# Patient Record
Sex: Male | Born: 1955 | Race: White | Hispanic: No | Marital: Married | Smoking: Never smoker
Health system: Southern US, Community
[De-identification: ages and names within clinical notes are randomized; demographics above are authoritative.]

## PROBLEM LIST (undated history)

## (undated) DIAGNOSIS — F419 Anxiety disorder, unspecified: Secondary | ICD-10-CM

## (undated) DIAGNOSIS — T7840XA Allergy, unspecified, initial encounter: Secondary | ICD-10-CM

## (undated) HISTORY — PX: ANKLE SURGERY: SHX546

## (undated) HISTORY — PX: EAR CYST EXCISION: SHX22

## (undated) HISTORY — DX: Anxiety disorder, unspecified: F41.9

## (undated) HISTORY — DX: Allergy, unspecified, initial encounter: T78.40XA

## (undated) HISTORY — PX: HERNIA REPAIR: SHX51

---

## 1998-08-26 ENCOUNTER — Encounter (HOSPITAL_BASED_OUTPATIENT_CLINIC_OR_DEPARTMENT_OTHER): Payer: Self-pay | Admitting: General Surgery

## 1998-08-28 ENCOUNTER — Ambulatory Visit (HOSPITAL_COMMUNITY): Admission: RE | Admit: 1998-08-28 | Discharge: 1998-08-28 | Payer: Self-pay | Admitting: General Surgery

## 2000-01-14 ENCOUNTER — Encounter: Admission: RE | Admit: 2000-01-14 | Discharge: 2000-01-14 | Payer: Self-pay | Admitting: *Deleted

## 2000-01-14 ENCOUNTER — Encounter: Payer: Self-pay | Admitting: *Deleted

## 2011-10-02 ENCOUNTER — Emergency Department (HOSPITAL_COMMUNITY): Payer: BC Managed Care – PPO

## 2011-10-02 ENCOUNTER — Other Ambulatory Visit: Payer: Self-pay

## 2011-10-02 ENCOUNTER — Emergency Department (HOSPITAL_COMMUNITY)
Admission: EM | Admit: 2011-10-02 | Discharge: 2011-10-03 | Disposition: A | Discharge status: Home / Self Care | Payer: BC Managed Care – PPO | Attending: Emergency Medicine | Admitting: Emergency Medicine

## 2011-10-02 ENCOUNTER — Encounter (HOSPITAL_COMMUNITY): Payer: Self-pay | Admitting: Emergency Medicine

## 2011-10-02 DIAGNOSIS — K429 Umbilical hernia without obstruction or gangrene: Secondary | ICD-10-CM | POA: Insufficient documentation

## 2011-10-02 DIAGNOSIS — R1013 Epigastric pain: Secondary | ICD-10-CM | POA: Insufficient documentation

## 2011-10-02 DIAGNOSIS — E669 Obesity, unspecified: Secondary | ICD-10-CM | POA: Insufficient documentation

## 2011-10-02 LAB — URINALYSIS, ROUTINE W REFLEX MICROSCOPIC
Bilirubin Urine: NEGATIVE
Glucose, UA: NEGATIVE mg/dL
Hgb urine dipstick: NEGATIVE
Ketones, ur: NEGATIVE mg/dL
Leukocytes, UA: NEGATIVE
Nitrite: NEGATIVE
Protein, ur: NEGATIVE mg/dL
Specific Gravity, Urine: 1.019 (ref 1.005–1.030)
Urobilinogen, UA: 0.2 mg/dL (ref 0.0–1.0)
pH: 8 (ref 5.0–8.0)

## 2011-10-02 LAB — DIFFERENTIAL
Basophils Absolute: 0 10*3/uL (ref 0.0–0.1)
Basophils Relative: 1 % (ref 0–1)
Eosinophils Absolute: 0.2 10*3/uL (ref 0.0–0.7)
Eosinophils Relative: 2 % (ref 0–5)
Lymphocytes Relative: 25 % (ref 12–46)
Lymphs Abs: 2.2 10*3/uL (ref 0.7–4.0)
Monocytes Absolute: 0.7 10*3/uL (ref 0.1–1.0)
Monocytes Relative: 8 % (ref 3–12)
Neutro Abs: 5.7 10*3/uL (ref 1.7–7.7)
Neutrophils Relative %: 65 % (ref 43–77)

## 2011-10-02 LAB — CBC
HCT: 43 % (ref 39.0–52.0)
Hemoglobin: 15 g/dL (ref 13.0–17.0)
MCH: 31.4 pg (ref 26.0–34.0)
MCHC: 34.9 g/dL (ref 30.0–36.0)
MCV: 90 fL (ref 78.0–100.0)
Platelets: 240 10*3/uL (ref 150–400)
RBC: 4.78 MIL/uL (ref 4.22–5.81)
RDW: 13 % (ref 11.5–15.5)
WBC: 8.8 10*3/uL (ref 4.0–10.5)

## 2011-10-02 LAB — LIPASE, BLOOD: Lipase: 31 U/L (ref 11–59)

## 2011-10-02 LAB — BASIC METABOLIC PANEL
BUN: 17 mg/dL (ref 6–23)
CO2: 30 mEq/L (ref 19–32)
Calcium: 9.3 mg/dL (ref 8.4–10.5)
Chloride: 99 mEq/L (ref 96–112)
Creatinine, Ser: 1.41 mg/dL — ABNORMAL HIGH (ref 0.50–1.35)
GFR calc Af Amer: 64 mL/min — ABNORMAL LOW (ref >90)
GFR calc non Af Amer: 55 mL/min — ABNORMAL LOW (ref >90)
Glucose, Bld: 138 mg/dL — ABNORMAL HIGH (ref 70–99)
Potassium: 3.6 mEq/L (ref 3.5–5.1)
Sodium: 138 mEq/L (ref 135–145)

## 2011-10-02 MED ORDER — SODIUM CHLORIDE 0.9 % IV SOLN
Freq: Once | INTRAVENOUS | Status: AC
  Administered 2011-10-02: 21:00:00 via INTRAVENOUS

## 2011-10-02 MED ORDER — GI COCKTAIL ~~LOC~~
30.0000 mL | Freq: Once | ORAL | Status: AC
Start: 2011-10-02 — End: 2011-10-02
  Administered 2011-10-02: 30 mL via ORAL
  Filled 2011-10-02: qty 30

## 2011-10-02 NOTE — ED Notes (Signed)
Pt states that he has been having abdominal pain since this morning. Pain feels like gas moving but pt not sure since pain is sharp. Pt states that the pain was upper GI and he was burping so he tried tums and sode water with no relief from pain but pain is now lower abdomen. Pt states that he has fat poking through his umbilcal area but no surgery needed at this time. Pt states that he has been having regular bowel movements but that they have been harder since the past day. Pt alert and oriented and able to follow commands and move extremities. Pt unable to urinate at this time.

## 2011-10-02 NOTE — Discharge Instructions (Signed)
Return to the emergency department if your pain is getting worse.  Abdominal Pain Abdominal pain can be caused by many things. Your caregiver decides the seriousness of your pain by an examination and possibly blood tests and X-rays. Many cases can be observed and treated at home. Most abdominal pain is not caused by a disease and will probably improve without treatment. However, in many cases, more time must pass before a clear cause of the pain can be found. Before that point, it may not be known if you need more testing, or if hospitalization or surgery is needed. HOME CARE INSTRUCTIONS   Do not take laxatives unless directed by your caregiver.   Take pain medicine only as directed by your caregiver.   Only take over-the-counter or prescription medicines for pain, discomfort, or fever as directed by your caregiver.   Try a clear liquid diet (broth, tea, or water) for as long as directed by your caregiver. Slowly move to a bland diet as tolerated.  SEEK IMMEDIATE MEDICAL CARE IF:   The pain does not go away.   You have a fever.   You keep throwing up (vomiting).   The pain is felt only in portions of the abdomen. Pain in the right side could possibly be appendicitis. In an adult, pain in the left lower portion of the abdomen could be colitis or diverticulitis.   You pass bloody or black tarry stools.  MAKE SURE YOU:   Understand these instructions.   Will watch your condition.   Will get help right away if you are not doing well or get worse.  Document Released: 04/21/2005 Document Revised: 12/06/ 12 Document Reviewed: 02/28/2008 [facility] Patient Information 2012 [place], [place].

## 2011-10-02 NOTE — ED Provider Notes (Signed)
History     CSN: [id number]  Arrival date & time 10/02/11  2004   First MD Initiated Contact with Patient 10/02/11 2048      Chief Complaint  Patient presents with  . Abdominal Pain    (Consider location/radiation/quality/duration/timing/severity/associated sxs/prior treatment) Patient is a 56 y.o. male presenting with abdominal pain. The history is provided by the patient.  Abdominal Pain The primary symptoms of the illness include abdominal pain.  He was eating dinner at 1730 when he started having some crampy upper, pain. Pain would move from location to location. Started and subcostal area and move from side to side, then moved into the midline between the umbilicus and xiphoid, and now has moved back up into the subxiphoid area. Nothing makes the pain better, nothing makes it worse. There is no associated nausea, vomiting, diarrhea. He denies any chest pain, dyspnea, diaphoresis. He feels bubbles going through like it's gas. He also has an umbilical hernia which has not changed. Pain was rated at 5/10 at its worse, is 0/10 currently.  History reviewed. No pertinent past medical history.  History reviewed. No pertinent past surgical history.  No family history on file.  History  Substance Use Topics  . Smoking status: Never Smoker   . Smokeless tobacco: Not on file  . Alcohol Use: Yes      Review of Systems  Gastrointestinal: Positive for abdominal pain.  All other systems reviewed and are negative.    Allergies  Review of patient's allergies indicates no known allergies.  Home Medications   Current Outpatient Rx  Name Route Sig Dispense Refill  . CALCIUM CARBONATE ANTACID 500 MG PO CHEW Oral Chew 3 tablets by mouth daily as needed. For heartburn      BP 156/90  Pulse 60  Temp(Src) 98.2 F (36.8 C) (Oral)  Resp 20  SpO2 96%  Physical Exam  Nursing note and vitals reviewed.  56 year old male who is obese. He is resting comfortably and in no acute  distress. Vital signs are significant for mild hypertension with blood pressure 156/90. Oxygen saturation is 96% which is normal. Head is normocephalic and atraumatic. PERRLA, EOMI. There is no scleral icterus. Oropharynx is clear. Neck is nontender and supple without adenopathy. Lungs are clear without rales, wheezes, rhonchi. Back is nontender there's no CVA tenderness. Heart has regular rate and rhythm without murmur. Abdomen is soft and flat. Moderate size umbilical hernia is present which is easily reducible, but is tender when reduced. There is also mild to moderate epigastric tenderness. There is no hepatosplenomegaly. Peristalsis is normal active. Extremities have no cyanosis or edema, full range of motion is present. Skin is warm and dry without rash. Neurologic: Mental status is normal, cranial nerves are intact, there no focal motor or sensory deficits.  ED Course  Procedures (including critical care time)  Results for orders placed during the hospital encounter of 10/02/11  CBC      Component Value Range   WBC 8.8  4.0 - 10.5 (K/uL)   RBC 4.78  4.22 - 5.81 (MIL/uL)   Hemoglobin 15.0  13.0 - 17.0 (g/dL)   HCT 40.9  81.1 - 91.4 (%)   MCV 90.0  78.0 - 100.0 (fL)   MCH 31.4  26.0 - 34.0 (pg)   MCHC 34.9  30.0 - 36.0 (g/dL)   RDW 78.2  95.6 - 21.3 (%)   Platelets 240  150 - 400 (K/uL)  DIFFERENTIAL      Component Value Range  Neutrophils Relative 65  43 - 77 (%)   Neutro Abs 5.7  1.7 - 7.7 (K/uL)   Lymphocytes Relative 25  12 - 46 (%)   Lymphs Abs 2.2  0.7 - 4.0 (K/uL)   Monocytes Relative 8  3 - 12 (%)   Monocytes Absolute 0.7  0.1 - 1.0 (K/uL)   Eosinophils Relative 2  0 - 5 (%)   Eosinophils Absolute 0.2  0.0 - 0.7 (K/uL)   Basophils Relative 1  0 - 1 (%)   Basophils Absolute 0.0  0.0 - 0.1 (K/uL)  BASIC METABOLIC PANEL      Component Value Range   Sodium 138  135 - 145 (mEq/L)   Potassium 3.6  3.5 - 5.1 (mEq/L)   Chloride 99  96 - 112 (mEq/L)   CO2 30  19 - 32 (mEq/L)    Glucose, Bld 138 (*) 70 - 99 (mg/dL)   BUN 17  6 - 23 (mg/dL)   Creatinine, Ser 9.14 (*) 0.50 - 1.35 (mg/dL)   Calcium 9.3  8.4 - 78.2 (mg/dL)   GFR calc non Af Amer 55 (*) >90 (mL/min)   GFR calc Af Amer 64 (*) >90 (mL/min)  URINALYSIS, ROUTINE W REFLEX MICROSCOPIC      Component Value Range   Color, Urine YELLOW  YELLOW    APPearance TURBID (*) CLEAR    Specific Gravity, Urine 1.019  1.005 - 1.030    pH 8.0  5.0 - 8.0    Glucose, UA NEGATIVE  NEGATIVE (mg/dL)   Hgb urine dipstick NEGATIVE  NEGATIVE    Bilirubin Urine NEGATIVE  NEGATIVE    Ketones, ur NEGATIVE  NEGATIVE (mg/dL)   Protein, ur NEGATIVE  NEGATIVE (mg/dL)   Urobilinogen, UA 0.2  0.0 - 1.0 (mg/dL)   Nitrite NEGATIVE  NEGATIVE    Leukocytes, UA NEGATIVE  NEGATIVE   LIPASE, BLOOD      Component Value Range   Lipase 31  11 - 59 (U/L)  URINE MICROSCOPIC-ADD ON      Component Value Range   Squamous Epithelial / LPF RARE  RARE    WBC, UA 0-2  <3 (WBC/hpf)   RBC / HPF 0-2  <3 (RBC/hpf)   Urine-Other AMORPHOUS URATES/PHOSPHATES     Dg Abd Acute W/chest  10/02/2011  *RADIOLOGY REPORT*  Clinical Data: Abdominal pain.  ACUTE ABDOMEN SERIES (ABDOMEN 2 VIEW & CHEST 1 VIEW)  Comparison: None.  Findings: Normal sized heart.  Small amount of linear density at the left lung base.  Otherwise, clear lungs.  Paucity of intestinal gas with no dilated bowel loops or free peritoneal air seen.  Mild scoliosis.  IMPRESSION:  1.  Minimal left basilar atelectasis or scarring. 2.  Paucity of intestinal gas with no acute abdominal abnormality seen.  Original Report Authenticated By: Darrol Angel, M.D.      Date: 10/02/2011  Rate: 53  Rhythm: sinus bradycardia  QRS Axis: normal  Intervals: normal  ST/T Wave abnormalities: normal  Conduction Disutrbances:none  Narrative Interpretation: Sinus bradycardia, otherwise normal ECG. When compared with ECG of 08/26/1998, no significant changes are seen.   Old EKG Reviewed: none available  He was  given a GI cocktail with significant but not complete relief of his pain. He continues to have a sense of bubbles moving around his upper abdomen. Reexam continues to show benign abdomen. He is sent home with instructions to followup with his family doctor and return to the emergency department if symptoms worsen. He  is given a referral to [facility] surgery for evaluation of his umbilical hernia.  1. Abdominal pain   2. Umbilical hernia       MDM  Abdominal pain which is probably benign dyspepsia. Workup has been ordered and will be given a therapeutic trial of a GI cocktail.        Dione Booze, MD 10/02/11 2340

## 2011-10-02 NOTE — ED Notes (Addendum)
Requested pt attempt to provide urine sample. Pt stated he was unable and would provide one as soon as possible.

## 2011-10-02 NOTE — ED Notes (Signed)
PT. REPORTS GENERALIZED ABDOMINAL PAIN ONSET THIS AFTERNOON WHILE EATING , " FEELS LIKE GAS" , DENIES NAUSEA OR VOMITTING .

## 2012-06-09 ENCOUNTER — Ambulatory Visit (INDEPENDENT_AMBULATORY_CARE_PROVIDER_SITE_OTHER): Payer: Self-pay | Admitting: General Surgery

## 2012-06-13 ENCOUNTER — Other Ambulatory Visit (INDEPENDENT_AMBULATORY_CARE_PROVIDER_SITE_OTHER): Payer: Self-pay | Admitting: General Surgery

## 2012-06-13 ENCOUNTER — Encounter (INDEPENDENT_AMBULATORY_CARE_PROVIDER_SITE_OTHER): Payer: Self-pay | Admitting: General Surgery

## 2012-06-13 ENCOUNTER — Ambulatory Visit (INDEPENDENT_AMBULATORY_CARE_PROVIDER_SITE_OTHER): Payer: PRIVATE HEALTH INSURANCE | Admitting: General Surgery

## 2012-06-13 VITALS — BP 119/77 | HR 82 | Temp 98.0°F | Resp 16 | Ht 73.5 in | Wt 216.8 lb

## 2012-06-13 DIAGNOSIS — K429 Umbilical hernia without obstruction or gangrene: Secondary | ICD-10-CM

## 2012-06-13 NOTE — Progress Notes (Signed)
Patient ID: Cameron Grant, male   DOB: 1956-03-12, 56 y.o.   MRN: [id number]  Chief Complaint  Patient presents with  . Hernia    HPI Cameron Grant is a 56 y.o. male.  Who is seen for several year history of a umbilical hernia.the patient was recently seen in the ER her abdominal and epigastric pain. Patient was ruled out for cardiac event. Patient's had no incidence of strangulation or obstructive symptoms. HPI  History reviewed. No pertinent past medical history.  Past Surgical History  Procedure Date  . Ankle surgery   . Ear cyst excision     History reviewed. No pertinent family history.  Social History History  Substance Use Topics  . Smoking status: Never Smoker   . Smokeless tobacco: Not on file  . Alcohol Use: 1.2 oz/week    2 Cans of beer per week    No Known Allergies  No current outpatient prescriptions on file.    Review of Systems Review of Systems  All other systems reviewed and are negative.    Blood pressure 119/77, pulse 82, temperature 98 F (36.7 C), temperature source Temporal, resp. rate 16, height 6' 1.5" (1.867 m), weight 216 lb 12.8 oz (98.34 kg).  Physical Exam Physical Exam  Constitutional: He is oriented to person, place, and time. He appears well-developed and well-nourished.  HENT:  Head: Normocephalic and atraumatic.  Eyes: Conjunctivae normal and EOM are normal. Pupils are equal, round, and reactive to light.  Neck: Normal range of motion.  Cardiovascular: Normal rate, regular rhythm and normal heart sounds.   Pulmonary/Chest: Effort normal and breath sounds normal.  Abdominal: Soft. Bowel sounds are normal.  Musculoskeletal: Normal range of motion.  Neurological: He is alert and oriented to person, place, and time.    Data Reviewed none  Assessment    56 year old male with an umbilical hernia.    Plan    1. We will proceed to the operating room for laparoscopic repair with mesh.  2.All risks and benefits were  discussed with the patient, to generally include infection, bleeding, damage to surrounding structures, and recurrence. Alternatives were offered and described.  All questions were answered and the patient voiced understanding of the procedure and wishes to proceed at this point.        Marigene Ehlers., Muhammad Vacca 06/13/2012, 3:17 PM

## 2012-07-17 DIAGNOSIS — K429 Umbilical hernia without obstruction or gangrene: Secondary | ICD-10-CM

## 2012-08-04 ENCOUNTER — Encounter (INDEPENDENT_AMBULATORY_CARE_PROVIDER_SITE_OTHER): Payer: Self-pay | Admitting: General Surgery

## 2012-08-04 ENCOUNTER — Ambulatory Visit (INDEPENDENT_AMBULATORY_CARE_PROVIDER_SITE_OTHER): Payer: PRIVATE HEALTH INSURANCE | Admitting: General Surgery

## 2012-08-04 VITALS — BP 118/82 | HR 62 | Temp 97.0°F | Resp 18 | Ht 73.0 in | Wt 214.2 lb

## 2012-08-04 DIAGNOSIS — Z09 Encounter for follow-up examination after completed treatment for conditions other than malignant neoplasm: Secondary | ICD-10-CM

## 2012-08-04 NOTE — Progress Notes (Signed)
Patient ID: Cameron Grant, male   DOB: 06-18-1956, 57 y.o.   MRN: [id number] The patient is a 57 year old status post laparoscopic umbilical hernia repair with mesh. Patient is to do well postoperatively and is not any pain. He does feel some stretching of his abdominal wall at times But this is improving.  On exam: Wound clean dry and intact  No palpable hernia on palpation  Assessment and plan: Patient follow up when necessary We discussed weight restrictions for another 2-3 weeks

## 2014-04-20 ENCOUNTER — Ambulatory Visit (INDEPENDENT_AMBULATORY_CARE_PROVIDER_SITE_OTHER): Payer: No Typology Code available for payment source | Admitting: Family Medicine

## 2014-04-20 VITALS — BP 124/80 | HR 65 | Temp 98.0°F | Resp 16 | Ht 72.0 in | Wt 228.2 lb

## 2014-04-20 DIAGNOSIS — Z1383 Encounter for screening for respiratory disorder NEC: Secondary | ICD-10-CM

## 2014-04-20 DIAGNOSIS — Z1211 Encounter for screening for malignant neoplasm of colon: Secondary | ICD-10-CM

## 2014-04-20 DIAGNOSIS — Z1389 Encounter for screening for other disorder: Secondary | ICD-10-CM

## 2014-04-20 DIAGNOSIS — Z0000 Encounter for general adult medical examination without abnormal findings: Secondary | ICD-10-CM

## 2014-04-20 DIAGNOSIS — Z136 Encounter for screening for cardiovascular disorders: Secondary | ICD-10-CM

## 2014-04-20 DIAGNOSIS — Z23 Encounter for immunization: Secondary | ICD-10-CM

## 2014-04-20 DIAGNOSIS — Z125 Encounter for screening for malignant neoplasm of prostate: Secondary | ICD-10-CM

## 2014-04-20 LAB — COMPREHENSIVE METABOLIC PANEL
ALBUMIN: 4.3 g/dL (ref 3.5–5.2)
ALT: 19 U/L (ref 0–53)
AST: 17 U/L (ref 0–37)
Alkaline Phosphatase: 59 U/L (ref 39–117)
BUN: 14 mg/dL (ref 6–23)
CALCIUM: 10 mg/dL (ref 8.4–10.5)
CHLORIDE: 102 meq/L (ref 96–112)
CO2: 27 meq/L (ref 19–32)
Creat: 1.11 mg/dL (ref 0.50–1.35)
GLUCOSE: 102 mg/dL — AB (ref 70–99)
POTASSIUM: 4.7 meq/L (ref 3.5–5.3)
Sodium: 136 mEq/L (ref 135–145)
Total Bilirubin: 0.7 mg/dL (ref 0.2–1.2)
Total Protein: 7.6 g/dL (ref 6.0–8.3)

## 2014-04-20 LAB — LIPID PANEL
CHOLESTEROL: 163 mg/dL (ref 0–200)
HDL: 42 mg/dL (ref >39)
LDL Cholesterol: 101 mg/dL — ABNORMAL HIGH (ref 0–99)
Total CHOL/HDL Ratio: 3.9 Ratio
Triglycerides: 102 mg/dL (ref <150)
VLDL: 20 mg/dL (ref 0–40)

## 2014-04-20 LAB — CBC
HCT: 42.3 % (ref 39.0–52.0)
Hemoglobin: 14.7 g/dL (ref 13.0–17.0)
MCH: 30.9 pg (ref 26.0–34.0)
MCHC: 34.8 g/dL (ref 30.0–36.0)
MCV: 88.9 fL (ref 78.0–100.0)
PLATELETS: 253 10*3/uL (ref 150–400)
RBC: 4.76 MIL/uL (ref 4.22–5.81)
RDW: 13.7 % (ref 11.5–15.5)
WBC: 5.4 10*3/uL (ref 4.0–10.5)

## 2014-04-20 LAB — TSH: TSH: 1.417 u[IU]/mL (ref 0.350–4.500)

## 2014-04-20 NOTE — Progress Notes (Signed)
Subjective:  This chart was scribed for Norberto Sorenson, MD by Andrew Au, ED Scribe. This patient was seen in room 3 and the patient's care was started at 9:51 AM.  Patient ID: Cameron Grant, male    DOB: 09-26-1955, 58 y.o.   MRN: [id number]  HPI Chief Complaint  Patient presents with  . cancer screening    for insurance    HPI Comments: Cameron Grant is a 58 y.o. male who presents to the [facility] for a cancer screening for insurance.   TDAP- Pt is unsure of last TDAP. Pt denies Flu vaccine.  Pt reports family hx of breast cancer. He states his mother had breast cancer 3 times.  Pt denies hx of colonoscopy. Pt wants a colonoscopy in the future. Pt is requesting PSA testing.    Pt is a fomer smoker who quit in 1992. He reports he used to smoke a pack a day. Pt denies alcohol. Pt takes multi vitamins.    Pt has not eaten today.  Pt denies PCP.  Past Medical History  Diagnosis Date  . Allergy   . Anxiety    Past Surgical History  Procedure Laterality Date  . Ankle surgery    . Ear cyst excision     Prior to Admission medications   Not on File   No Known Allergies Family History  Problem Relation Age of Onset  . Cancer Mother   . Heart disease Father   . Heart disease Maternal Grandmother   . Stroke Maternal Grandmother   . Stroke Paternal Grandfather   . Heart disease Paternal Grandfather    History   Social History  . Marital Status: Married    Spouse Name: N/A    Number of Children: N/A  . Years of Education: N/A   Social History Main Topics  . Smoking status: Never Smoker   . Smokeless tobacco: None  . Alcohol Use: 1.2 oz/week    2 Cans of beer per week  . Drug Use: No  . Sexual Activity: None   Other Topics Concern  . None   Social History Narrative  . None    Review of Systems  Constitutional: Negative for fever and chills.  Gastrointestinal: Negative for nausea, vomiting and diarrhea.  Musculoskeletal: Negative for  myalgias.   Objective:   Physical Exam  Nursing note and vitals reviewed. Constitutional: He is oriented to person, place, and time. He appears well-developed and well-nourished. No distress.  HENT:  Head: Normocephalic and atraumatic.  Right Ear: Tympanic membrane is retracted.  Left Ear: Tympanic membrane is retracted.  Mouth/Throat: Uvula is midline, oropharynx is clear and moist and mucous membranes are normal.  Eyes: Conjunctivae and EOM are normal.  Neck: Neck supple. No mass and no thyromegaly present.  Cardiovascular: Normal rate, regular rhythm, S1 normal, S2 normal, normal heart sounds and intact distal pulses.  Exam reveals no gallop and no friction rub.   No murmur heard. Pulses:      Dorsalis pedis pulses are 2+ on the right side, and 2+ on the left side.  Pulmonary/Chest: Effort normal and breath sounds normal. No respiratory distress. He has no wheezes. He has no rales. He exhibits no tenderness.  Abdominal: Soft. Bowel sounds are normal. There is no hepatosplenomegaly, splenomegaly or hepatomegaly. There is no tenderness. Hernia confirmed negative in the right inguinal area and confirmed negative in the left inguinal area.  Musculoskeletal: Normal range of motion.  Negative straight leg.  No LE edema. Normal cervical ROM. Normal shoulder ROM. No point tenderness. No spinous tenderness C, T and L spine. Normal thoracic and lumbar  Lymphadenopathy:    He has no cervical adenopathy.  Neurological: He is alert and oriented to person, place, and time.  Reflex Scores:      Patellar reflexes are 2+ on the right side and 2+ on the left side. Skin: Skin is warm and dry.  Psychiatric: He has a normal mood and affect. His behavior is normal.   Filed Vitals:   04/20/14 0913  BP: 124/80  Pulse: 65  Temp: 98 F (36.7 C)  TempSrc: Oral  Resp: 16  Height: 6' (1.829 m)  Weight: 228 lb 3.2 oz (103.511 kg)  SpO2: 98%     Assessment & Plan:    Pt had an EKG march 2013.  Recommended digital rectal exam at CPE next yr. Routine general medical examination at a health care facility - Plan: Lipid panel, Comprehensive metabolic panel, PSA, TSH, CBC, CANCELED: CBC  Special screening for malignant neoplasms, colon - Plan: Lipid panel, Comprehensive metabolic panel, PSA, TSH, Ambulatory referral to Gastroenterology, CBC, CANCELED: CBC  Screening for prostate cancer - Plan: Lipid panel, Comprehensive metabolic panel, PSA, TSH, CBC, CANCELED: CBC  Screening for cardiovascular, respiratory, and genitourinary diseases - Plan: Lipid panel, Comprehensive metabolic panel, PSA, TSH, CBC, CANCELED: CBC   I personally performed the services described in this documentation, which was scribed in my presence. The recorded information has been reviewed and considered, and addended by me as needed.  Norberto Sorenson, MD MPH   Results for orders placed in visit on 04/20/14  LIPID PANEL      Result Value Ref Range   Cholesterol 163  0 - 200 mg/dL   Triglycerides 161  <096 mg/dL   HDL 42  >04 mg/dL   Total CHOL/HDL Ratio 3.9     VLDL 20  0 - 40 mg/dL   LDL Cholesterol 540 (*) 0 - 99 mg/dL  COMPREHENSIVE METABOLIC PANEL      Result Value Ref Range   Sodium 136  135 - 145 mEq/L   Potassium 4.7  3.5 - 5.3 mEq/L   Chloride 102  96 - 112 mEq/L   CO2 27  19 - 32 mEq/L   Glucose, Bld 102 (*) 70 - 99 mg/dL   BUN 14  6 - 23 mg/dL   Creat 9.81  1.91 - 4.78 mg/dL   Total Bilirubin 0.7  0.2 - 1.2 mg/dL   Alkaline Phosphatase 59  39 - 117 U/L   AST 17  0 - 37 U/L   ALT 19  0 - 53 U/L   Total Protein 7.6  6.0 - 8.3 g/dL   Albumin 4.3  3.5 - 5.2 g/dL   Calcium 29.5  8.4 - 62.1 mg/dL  PSA      Result Value Ref Range   PSA 2.56  <=4.00 ng/mL  TSH      Result Value Ref Range   TSH 1.417  0.350 - 4.500 uIU/mL  CBC      Result Value Ref Range   WBC 5.4  4.0 - 10.5 K/uL   RBC 4.76  4.22 - 5.81 MIL/uL   Hemoglobin 14.7  13.0 - 17.0 g/dL   HCT 30.8  65.7 - 84.6 %   MCV 88.9  78.0 - 100.0  fL   MCH 30.9  26.0 - 34.0 pg   MCHC 34.8  30.0 - 36.0 g/dL  RDW 13.7  11.5 - 15.5 %   Platelets 253  150 - 400 K/uL

## 2014-04-20 NOTE — Patient Instructions (Signed)

## 2014-04-22 LAB — PSA: PSA: 2.56 ng/mL (ref <=4.00)

## 2014-04-23 ENCOUNTER — Encounter: Payer: Self-pay | Admitting: Family Medicine

## 2018-02-09 ENCOUNTER — Other Ambulatory Visit (HOSPITAL_COMMUNITY): Payer: 59

## 2018-02-09 ENCOUNTER — Ambulatory Visit (HOSPITAL_COMMUNITY)
Admission: RE | Admit: 2018-02-09 | Discharge: 2018-02-09 | Disposition: A | Discharge status: Home / Self Care | Payer: 59 | Source: Ambulatory Visit | Attending: Cardiology | Admitting: Cardiology

## 2018-02-09 ENCOUNTER — Encounter (HOSPITAL_COMMUNITY): Payer: Self-pay

## 2018-02-09 ENCOUNTER — Encounter (HOSPITAL_COMMUNITY)
Admission: RE | Disposition: A | Discharge status: Home / Self Care | Payer: Self-pay | Source: Ambulatory Visit | Attending: Cardiology

## 2018-02-09 ENCOUNTER — Other Ambulatory Visit: Payer: Self-pay

## 2018-02-09 DIAGNOSIS — G459 Transient cerebral ischemic attack, unspecified: Secondary | ICD-10-CM

## 2018-02-09 DIAGNOSIS — Z5309 Procedure and treatment not carried out because of other contraindication: Secondary | ICD-10-CM | POA: Diagnosis not present

## 2018-02-09 DIAGNOSIS — H3320 Serous retinal detachment, unspecified eye: Secondary | ICD-10-CM

## 2018-02-09 SURGERY — CANCELLED PROCEDURE

## 2018-02-09 NOTE — Progress Notes (Signed)
Patient came in today for a TEE and ate a poptart at 515 am. He also arrived with no one to drive him home. Patient reports that he was not given any instruction of what to do for this test today. Dr. Rosemary HolmsPatwardhan was notified and he instructed to reschedule for tomorrow at 3 pm.

## 2018-02-09 NOTE — H&P (Signed)
Cameron Grant is an 62 y.o. male.   Chief Complaint: Monoocular vision loss HPI:   62 y/o Caucasian male witj no significant past medical history. He was seen by [facility], Dr. Georges Mousehristopher Shah and found to have posterior vitreous detachment, as well as monoocular vision loss, concerning for TIA like symptoms. [facility]. He was started on ASA 325 mg.  Past Medical History:  Diagnosis Date  . Allergy   . Anxiety     Past Surgical History:  Procedure Laterality Date  . ANKLE SURGERY    . EAR CYST EXCISION      Family History  Problem Relation Age of Onset  . Cancer Mother   . Heart disease Father   . Heart disease Maternal Grandmother   . Stroke Maternal Grandmother   . Stroke Paternal Grandfather   . Heart disease Paternal Grandfather    Social History:  reports that he has never smoked. He has never used smokeless tobacco. He reports that he drinks about 1.2 oz of alcohol per week. He reports that he does not use drugs.  Allergies: No Known Allergies  ASA 325 mg   Review of Systems  Constitutional: Negative.   HENT: Negative.   Eyes:       Transient vision loss  Respiratory: Negative.   Cardiovascular: Negative for chest pain and palpitations.  Gastrointestinal: Negative.   Genitourinary: Negative.   Skin: Negative.   Neurological: Negative for loss of consciousness and weakness.  Endo/Heme/Allergies: Negative.   Psychiatric/Behavioral: Negative.   All other systems reviewed and are negative.   There were no vitals taken for this visit. Physical Exam  Nursing note and vitals reviewed. Constitutional: He is oriented to person, place, and time. He appears well-developed and well-nourished.  HENT:  Head: Normocephalic and atraumatic.  Eyes: Pupils are equal, round, and reactive to light. Conjunctivae are normal.  Neck: Normal range of motion. No JVD present.  Cardiovascular: Normal rate, regular rhythm  and normal heart sounds.  No murmur heard. Respiratory: Effort normal and breath sounds normal.  GI: Soft. Bowel sounds are normal.  Musculoskeletal: He exhibits no edema.  Lymphadenopathy:    He has no cervical adenopathy.  Neurological: He is alert and oriented to person, place, and time. No cranial nerve deficit.  Skin: Skin is warm and dry.  Psychiatric: He has a normal mood and affect.     Assessment/Plan  62 y/o Caucasian male with transient mono ocular vision loss similar to TIA  TEE Carotid duplex (Will be performed outpatient in the office)  Patient presented on 07/18 morning for TEE but was not NPO. TEE was thus rescheduled to 7/19 afternoon.  Elder NegusManish J Dajae Kizer, MD 02/09/2018, 8:38 AM  Elder NegusManish J Saray Capasso, MD [facility] Pager: [phone] Office: [phone] If no answer Cell [phone]

## 2018-02-10 ENCOUNTER — Ambulatory Visit (HOSPITAL_COMMUNITY)
Admission: RE | Admit: 2018-02-10 | Discharge: 2018-02-10 | Disposition: A | Discharge status: Home / Self Care | Payer: 59 | Source: Ambulatory Visit | Attending: Cardiology | Admitting: Cardiology

## 2018-02-10 ENCOUNTER — Encounter (HOSPITAL_COMMUNITY): Payer: Self-pay | Admitting: *Deleted

## 2018-02-10 ENCOUNTER — Encounter (HOSPITAL_COMMUNITY)
Admission: RE | Disposition: A | Discharge status: Home / Self Care | Payer: Self-pay | Source: Ambulatory Visit | Attending: Cardiology

## 2018-02-10 ENCOUNTER — Ambulatory Visit (HOSPITAL_COMMUNITY): Payer: 59

## 2018-02-10 DIAGNOSIS — F419 Anxiety disorder, unspecified: Secondary | ICD-10-CM | POA: Diagnosis not present

## 2018-02-10 DIAGNOSIS — Q211 Atrial septal defect: Secondary | ICD-10-CM | POA: Insufficient documentation

## 2018-02-10 DIAGNOSIS — Z8249 Family history of ischemic heart disease and other diseases of the circulatory system: Secondary | ICD-10-CM | POA: Insufficient documentation

## 2018-02-10 DIAGNOSIS — G459 Transient cerebral ischemic attack, unspecified: Secondary | ICD-10-CM

## 2018-02-10 DIAGNOSIS — H3320 Serous retinal detachment, unspecified eye: Secondary | ICD-10-CM

## 2018-02-10 HISTORY — PX: TEE WITHOUT CARDIOVERSION: SHX5443

## 2018-02-10 SURGERY — ECHOCARDIOGRAM, TRANSESOPHAGEAL
Anesthesia: Moderate Sedation

## 2018-02-10 MED ORDER — BUTAMBEN-TETRACAINE-BENZOCAINE 2-2-14 % EX AERO
INHALATION_SPRAY | CUTANEOUS | Status: DC | PRN
  Administered 2018-02-10: 2 via TOPICAL

## 2018-02-10 MED ORDER — DIPHENHYDRAMINE HCL 50 MG/ML IJ SOLN
INTRAMUSCULAR | Status: AC
  Filled 2018-02-10: qty 1

## 2018-02-10 MED ORDER — SODIUM CHLORIDE 0.9 % IV SOLN: INTRAVENOUS | Status: DC

## 2018-02-10 MED ORDER — FENTANYL CITRATE (PF) 100 MCG/2ML IJ SOLN
INTRAMUSCULAR | Status: DC | PRN
  Administered 2018-02-10: 50 ug via INTRAVENOUS

## 2018-02-10 MED ORDER — MIDAZOLAM HCL 5 MG/ML IJ SOLN
INTRAMUSCULAR | Status: DC | PRN
  Administered 2018-02-10 (×3): 1 mg via INTRAVENOUS

## 2018-02-10 MED ORDER — FENTANYL CITRATE (PF) 100 MCG/2ML IJ SOLN
INTRAMUSCULAR | Status: AC
  Filled 2018-02-10: qty 2

## 2018-02-10 MED ORDER — SODIUM CHLORIDE 0.9 % IV SOLN
INTRAVENOUS | Status: DC
  Administered 2018-02-10: 500 mL via INTRAVENOUS

## 2018-02-10 MED ORDER — MIDAZOLAM HCL 5 MG/ML IJ SOLN
INTRAMUSCULAR | Status: AC
  Filled 2018-02-10: qty 2

## 2018-02-10 NOTE — Progress Notes (Signed)
  Echocardiogram Echocardiogram Transesophageal has been performed.  Cameron Grant, Cameron Grant 02/10/2018, 3:39 PM

## 2018-02-10 NOTE — CV Procedure (Signed)
TEE: Under moderate sedation, TEE was performed without complications:  LV: Normal. Normal EF. RV: Normal LA: Normal. Left atrial appendage: Normal without thrombus. Normal function. 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  Double contrast study is strongly positive for atrial level shunting.  RA: Normal  MV: Normal. Mild MR. TV: Normal. Mild TR AV: Normal. Trace AI or AS. PV: Normal. Trace PI.  Thoracic and ascending aorta: Normal without significant plaque or atheromatous changes.  Conscious sedation protocol was followed, I personally administered conscious sedation and monitored the patient. Patient received 3 milligrams of Versed and 50 . Patient tolerated the procedure well and there was no complication from conscious sedation. Time administered was 25 and procedure ended at 3:30 PM.  Elder NegusManish J Yuji Walth, MD [facility] Pager: [phone] Office: [phone] If no answer Cell [phone]

## 2018-02-10 NOTE — Discharge Instructions (Signed)

## 2018-02-10 NOTE — Interval H&P Note (Signed)
History and Physical Interval Note:  02/10/2018 2:57 PM  Cameron Grant  has presented today for surgery, with the diagnosis of stroke  The various methods of treatment have been discussed with the patient and family. After consideration of risks, benefits and other options for treatment, the patient has consented to  Procedure(s): TRANSESOPHAGEAL ECHOCARDIOGRAM (TEE) (N/A) as a surgical intervention .  The patient's history has been reviewed, patient examined, no change in status, stable for surgery.  I have reviewed the patient's chart and labs.  Questions were answered to the patient's satisfaction.     [name] J Ceri Mayer

## 2018-02-13 ENCOUNTER — Encounter (HOSPITAL_COMMUNITY): Payer: Self-pay | Admitting: Cardiology

## 2018-02-17 ENCOUNTER — Other Ambulatory Visit (HOSPITAL_COMMUNITY): Payer: Self-pay | Admitting: Cardiology

## 2018-02-17 DIAGNOSIS — G459 Transient cerebral ischemic attack, unspecified: Secondary | ICD-10-CM

## 2018-02-25 ENCOUNTER — Other Ambulatory Visit (HOSPITAL_COMMUNITY): Payer: Self-pay | Admitting: Cardiology

## 2018-02-25 ENCOUNTER — Ambulatory Visit
Admission: RE | Admit: 2018-02-25 | Discharge: 2018-02-25 | Disposition: A | Discharge status: Home / Self Care | Payer: 59 | Source: Ambulatory Visit | Attending: Cardiology | Admitting: Cardiology

## 2018-02-25 DIAGNOSIS — Z77018 Contact with and (suspected) exposure to other hazardous metals: Secondary | ICD-10-CM

## 2018-02-25 DIAGNOSIS — G459 Transient cerebral ischemic attack, unspecified: Secondary | ICD-10-CM

## 2018-02-28 ENCOUNTER — Other Ambulatory Visit (HOSPITAL_COMMUNITY): Payer: Self-pay | Admitting: Cardiology

## 2018-02-28 DIAGNOSIS — Z1810 Retained metal fragments, unspecified: Secondary | ICD-10-CM

## 2018-02-28 DIAGNOSIS — Z77018 Contact with and (suspected) exposure to other hazardous metals: Secondary | ICD-10-CM

## 2018-03-08 ENCOUNTER — Ambulatory Visit
Admission: RE | Admit: 2018-03-08 | Discharge: 2018-03-08 | Disposition: A | Discharge status: Home / Self Care | Payer: 59 | Source: Ambulatory Visit | Attending: Cardiology | Admitting: Cardiology

## 2018-03-08 DIAGNOSIS — Z77018 Contact with and (suspected) exposure to other hazardous metals: Secondary | ICD-10-CM

## 2018-03-08 DIAGNOSIS — Z1810 Retained metal fragments, unspecified: Secondary | ICD-10-CM

## 2018-04-19 ENCOUNTER — Encounter: Payer: Self-pay | Admitting: Neurology

## 2018-04-19 ENCOUNTER — Telehealth: Payer: Self-pay | Admitting: Neurology

## 2018-04-19 ENCOUNTER — Ambulatory Visit: Payer: 59 | Admitting: Neurology

## 2018-04-19 VITALS — BP 110/80 | HR 72 | Ht 72.0 in | Wt 246.0 lb

## 2018-04-19 DIAGNOSIS — G459 Transient cerebral ischemic attack, unspecified: Secondary | ICD-10-CM

## 2018-04-19 DIAGNOSIS — Z5181 Encounter for therapeutic drug level monitoring: Secondary | ICD-10-CM | POA: Diagnosis not present

## 2018-04-19 NOTE — Telephone Encounter (Signed)
[employer] Auth: [id number] & [id number] (exp. 04/19/18 to 06/03/18) order sent to GI they will reach out to the pt to schedule.

## 2018-04-19 NOTE — Progress Notes (Signed)
Reason for visit: TIA, PFO  Referring physician: Dr. Modena Jansky is a 62 y.o. male  History of present illness:  Cameron Grant is a 62 year old right-handed white male with a history of bilateral posterior vitreous detachments.  The patient had 2 events in July 2019 involving his vision.  The first event came on suddenly involving both eyes at the same time.  The patient reports darkness in the lower visual fields and brightness in the upper visual field separated by a wavy line.  There was no color to this visual phenomenon that lasted only a few seconds and then gradually cleared over 1 minute with clearing centrally gradually spreading out to the periphery.  The patient had no headache following the event.  The patient reported no other symptoms of numbness, weakness, speech changes, or dizziness.  The patient had a second event a week or 2 later involving only the right eye.  The visual event in the right eye was identical to what was noted previously involving both eyes.  Once again, the episode cleared over 1 minute, the patient had a "muscle relaxant sensation" in the right head following the event.  He also had a sensation of dj vu.  The patient again had no focal symptoms of numbness, weakness, balance changes, or dizziness.  He had no headache with the second event.  The patient has been seen by his ophthalmologist.  He is followed for the vitreous detachments.  The patient has undergone a TEE evaluation that showed evidence of a fairly sizable PFO.  The patient is been placed on aspirin and Plavix.  He claims that he had a carotid Doppler study that was unremarkable, he has had a 30-day cardiac monitor study that did not show atrial fibrillation.  The patient claims that he had venous Doppler studies of the lower extremities and no DVT was noted.  The patient is sent to this office for evaluation of possible closure of his PFO.  The patient denies in the past any real history of  migraine, he has not had any transient focal neurologic deficit that could have represented a TIA.   Past Medical History:  Diagnosis Date  . Allergy   . Anxiety     Past Surgical History:  Procedure Laterality Date  . ANKLE SURGERY    . EAR CYST EXCISION    . HERNIA REPAIR    . TEE WITHOUT CARDIOVERSION N/A 02/10/2018   Procedure: TRANSESOPHAGEAL ECHOCARDIOGRAM (TEE);  Surgeon: Elder Negus, MD;  Location: [facility] ENDOSCOPY;  Service: Cardiovascular;  Laterality: N/A;    Family History  Problem Relation Age of Onset  . Cancer Mother   . Heart disease Father   . Heart disease Maternal Grandmother   . Stroke Maternal Grandmother   . Stroke Paternal Grandfather   . Heart disease Paternal Grandfather     Social history:  reports that he has never smoked. He has never used smokeless tobacco. He reports that he drinks about 2.0 standard drinks of alcohol per week. He reports that he does not use drugs.  Medications:  Prior to Admission medications   Medication Sig Start Date End Date Taking? Authorizing Provider  aspirin EC 325 MG tablet Take 325 mg by mouth daily.   Yes [provider]  clopidogrel (PLAVIX) 75 MG tablet Take 75 mg by mouth daily. 04/15/18  Yes [provider]  Multiple Vitamin (ONE-A-DAY MENS PO) Take 1 tablet by mouth daily.   Yes [provider]     No Known Allergies  ROS:  Out of a complete 14 system review of symptoms, the patient complains only of the following symptoms, and all other reviewed systems are negative.  Ringing in the ears Shortness of breath Easy bruising Runny nose  Weight 246 lb (111.6 kg).  Physical Exam  General: The patient is alert and cooperative at the time of the examination.  The patient is moderately obese.  Eyes: Pupils are equal, round, and reactive to light. Discs are flat bilaterally.  Neck: The neck is supple, no carotid bruits are noted.  Respiratory: The respiratory examination is  clear.  Cardiovascular: The cardiovascular examination reveals a regular rate and rhythm, no obvious murmurs or rubs are noted.  Skin: Extremities are without significant edema.  Neurologic Exam  Mental status: The patient is alert and oriented x 3 at the time of the examination. The patient has apparent normal recent and remote memory, with an apparently normal attention span and concentration ability.  Cranial nerves: Facial symmetry is present. There is good sensation of the face to pinprick and soft touch bilaterally. The strength of the facial muscles and the muscles to head turning and shoulder shrug are normal bilaterally. Speech is well enunciated, no aphasia or dysarthria is noted. Extraocular movements are full. Visual fields are full. The tongue is midline, and the patient has symmetric elevation of the soft palate. No obvious hearing deficits are noted.  Motor: The motor testing reveals 5 over 5 strength of all 4 extremities. Good symmetric motor tone is noted throughout.  Sensory: Sensory testing is intact to pinprick, soft touch, vibration sensation, and position sense on all 4 extremities. No evidence of extinction is noted.  Coordination: Cerebellar testing reveals good finger-nose-finger and heel-to-shin bilaterally.  Gait and station: Gait is normal. Tandem gait is normal. Romberg is negative. No drift is seen.  Reflexes: Deep tendon reflexes are symmetric and normal bilaterally. Toes are downgoing bilaterally.   TEE 02/10/18:  Study Conclusions  - Left ventricle: The estimated ejection fraction was in the range   of 55% to 60%. - Aortic valve: There was trivial regurgitation. - Mitral valve: There was mild regurgitation. - Left atrium: No evidence of thrombus in the atrial cavity or   appendage. - Atrial septum: Tunneled PFO with strongly positive agitated   saline contrast study.  Impressions:  - Tunneled PFO with strongly positive agitated saline  contrast   study.   No other cardioembolic source of TIA/CVA identified.     Assessment/Plan:  1.  Transient visual disturbance, etiology not clear  2.  PFO  The episodes of visual disturbance are not consistent with amaurosis fugax.  They are most consistent with a migrainous episode.  I believe that an embolic phenomenon from the heart is very unlikely.  Therefore, the PFO is likely to be completely asymptomatic.  For this reason, closure of the PFO is not recommended.  The patient will be sent for further blood work today, he will have CT angiogram of the head and neck, CT of the brain.  MRI cannot be done as the patient has metal in the right lower eyelid.  I will contact him with the results of the above.  I would recommend that the patient stop the Plavix and remain on aspirin therapy.  Marlan Palau MD 04/19/2018 1:56 PM  [facility] [address] [place], [place] [address]  Phone [phone] Fax [phone]

## 2018-04-21 ENCOUNTER — Telehealth: Payer: Self-pay | Admitting: *Deleted

## 2018-04-21 LAB — COMPREHENSIVE METABOLIC PANEL
A/G RATIO: 1.4 (ref 1.2–2.2)
ALT: 24 IU/L (ref 0–44)
AST: 21 IU/L (ref 0–40)
Albumin: 4.1 g/dL (ref 3.6–4.8)
Alkaline Phosphatase: 71 IU/L (ref 39–117)
BUN/Creatinine Ratio: 14 (ref 10–24)
BUN: 19 mg/dL (ref 8–27)
Bilirubin Total: 0.3 mg/dL (ref 0.0–1.2)
CALCIUM: 9.4 mg/dL (ref 8.6–10.2)
CO2: 24 mmol/L (ref 20–29)
CREATININE: 1.37 mg/dL — AB (ref 0.76–1.27)
Chloride: 101 mmol/L (ref 96–106)
GFR calc Af Amer: 64 mL/min/{1.73_m2} (ref >59)
GFR, EST NON AFRICAN AMERICAN: 56 mL/min/{1.73_m2} — AB (ref >59)
GLOBULIN, TOTAL: 3 g/dL (ref 1.5–4.5)
Glucose: 90 mg/dL (ref 65–99)
POTASSIUM: 4.2 mmol/L (ref 3.5–5.2)
SODIUM: 140 mmol/L (ref 134–144)
TOTAL PROTEIN: 7.1 g/dL (ref 6.0–8.5)

## 2018-04-21 LAB — LUPUS ANTICOAGULANT
DPT CONFIRM RATIO: 1.01 ratio (ref 0.00–1.40)
DPT: 35.8 s (ref 0.0–55.0)
Dilute Viper Venom Time: 30.2 s (ref 0.0–47.0)
PTT Lupus Anticoagulant: 33.8 s (ref 0.0–51.9)
THROMBIN TIME: 18.1 s (ref 0.0–23.0)

## 2018-04-21 LAB — HOMOCYSTEINE: Homocysteine: 12.3 umol/L (ref 0.0–15.0)

## 2018-04-21 LAB — SEDIMENTATION RATE: SED RATE: 4 mm/h (ref 0–30)

## 2018-04-21 NOTE — Telephone Encounter (Signed)
Spoke with patient and informed him his blood work is unremarkable with the exception of mild chronic stable renal impairment. He stated he didn't know he had kidney trouble except for kidney stones. He stated he does feel as if he might have an infection. This RN recommended he call his PCP. He stated "I'll just drink more water. That's what I usually do." He asked why Dr Anne Hahn said he had kidney impairment. This RN advised he most likely looked at past labs and saw some abnormal values. Advised him it may have been dehydration. He stated he has more tests on Monday; this RN advised he'll get a call when those results are available. He asked if he would have to come back in. This RN advised the message about his results would include any recommendations by Dr Anne Hahn depending on the results.  He verbalazied understanding, appreciation.

## 2018-04-24 ENCOUNTER — Ambulatory Visit
Admission: RE | Admit: 2018-04-24 | Discharge: 2018-04-24 | Disposition: A | Discharge status: Home / Self Care | Payer: 59 | Source: Ambulatory Visit | Attending: Neurology | Admitting: Neurology

## 2018-04-24 ENCOUNTER — Encounter: Payer: Self-pay | Admitting: Radiology

## 2018-04-24 ENCOUNTER — Telehealth: Payer: Self-pay | Admitting: Neurology

## 2018-04-24 DIAGNOSIS — G459 Transient cerebral ischemic attack, unspecified: Secondary | ICD-10-CM

## 2018-04-24 MED ORDER — IOPAMIDOL (ISOVUE-370) INJECTION 76%
75.0000 mL | Freq: Once | INTRAVENOUS | Status: AC | PRN
  Administered 2018-04-24: 75 mL via INTRAVENOUS

## 2018-04-24 NOTE — Telephone Encounter (Signed)
CT angiogram of the head neck is unremarkable.  I do not believe the patient suffered a TIA event, closure of the PFO is not recommended.   CTA head and neck 04/24/18:  IMPRESSION: No intracranial or extracranial stenosis, dissection, or occlusion.  No acute or focal intracranial abnormality. No findings to suggest recent cerebral infarction.

## 2020-06-09 IMAGING — CT CT ORBITS W/O CM
1 series · 16 of 30 positions shown, 20 images · non-contrast
Comparison: Abut radiographs 02/25/2018.

CLINICAL DATA: Retained metal fragments. Contact with heavy metals.
Abnormal orbit x-rays.

EXAM:
CT ORBITS WITHOUT CONTRAST
TECHNIQUE: Multidetector CT images were obtained using the standard protocol
without intravenous contrast.

[Series 4: orbits-soft · axial · 0.39mm/px · z∈[-126,-16]mm · 16 of 61 slices shown, 20 images]
[im 3/61  brain]
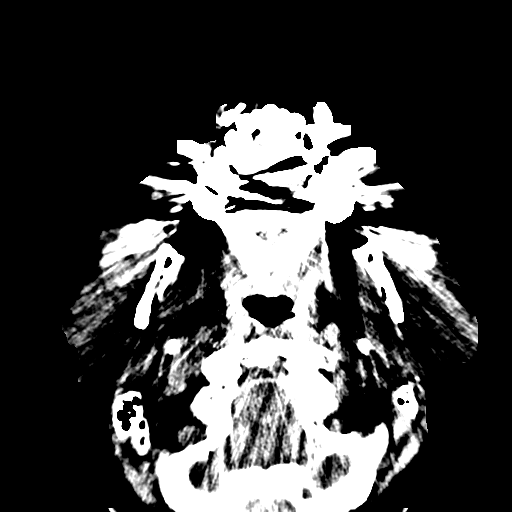
[im 3/61  bone]
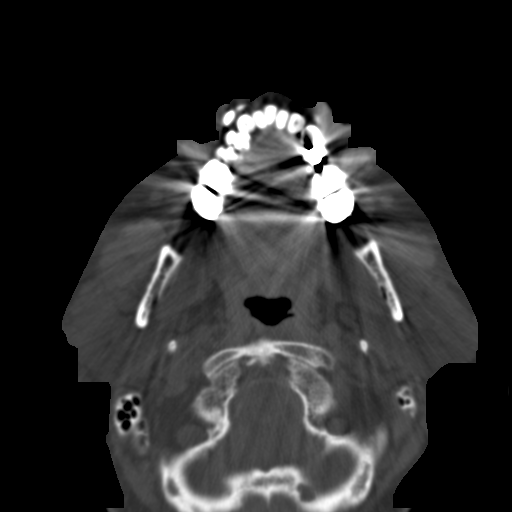
[im 7/61  bone]
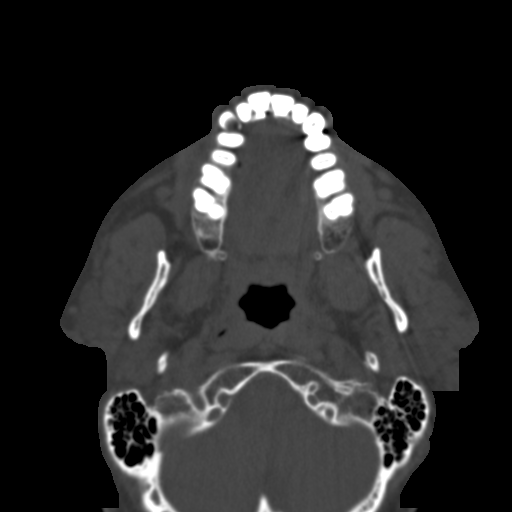
[im 11/61  bone]
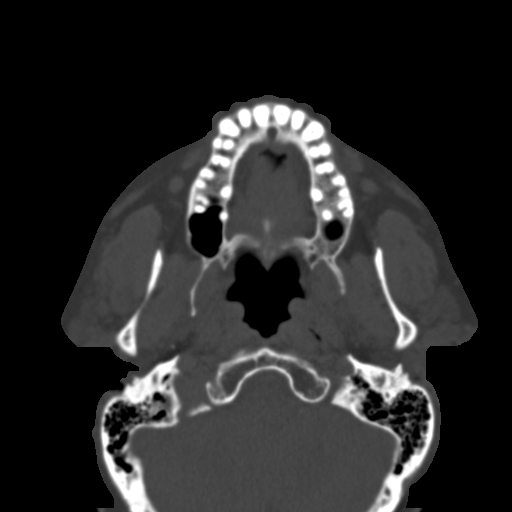
[im 15/61  bone]
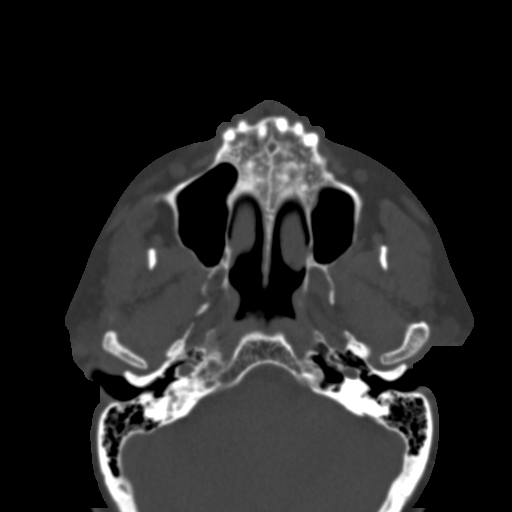
[im 17/61  brain]
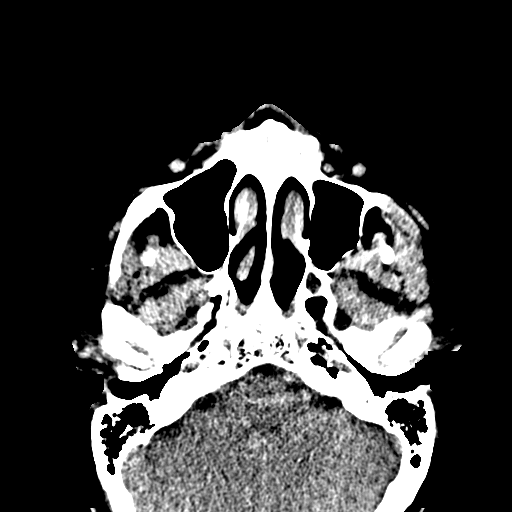
[im 17/61  bone]
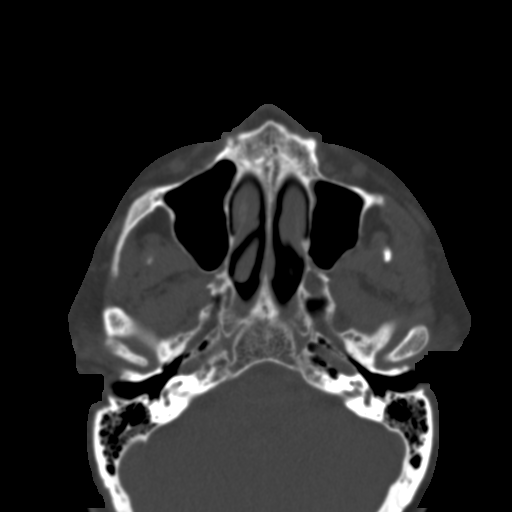
[im 21/61  bone]
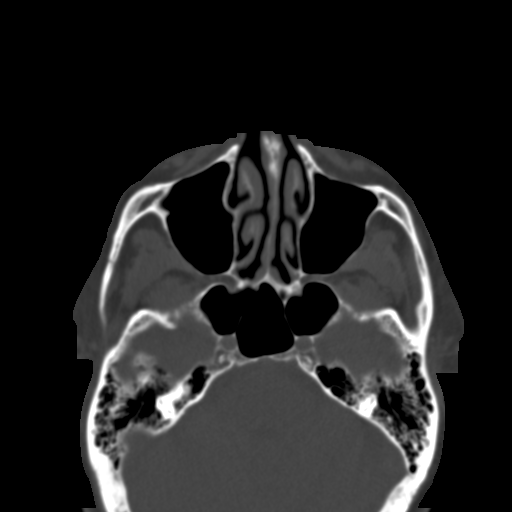
[im 25/61  bone]
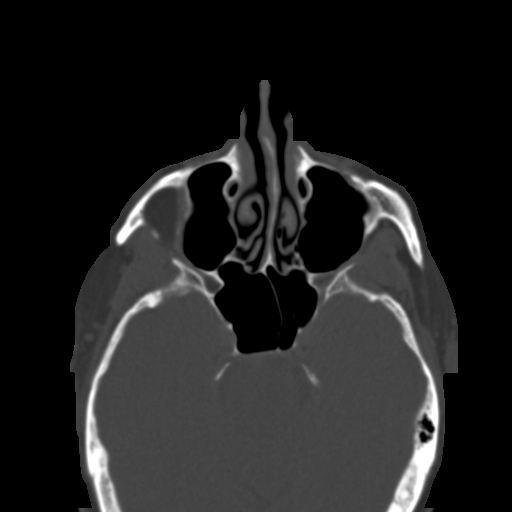
[im 29/61  bone]
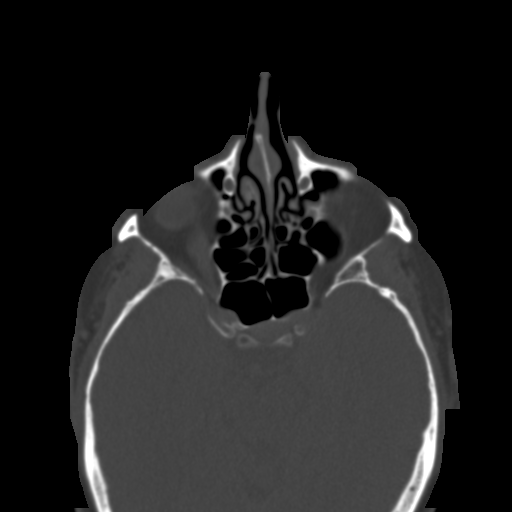
[im 32/61  brain]
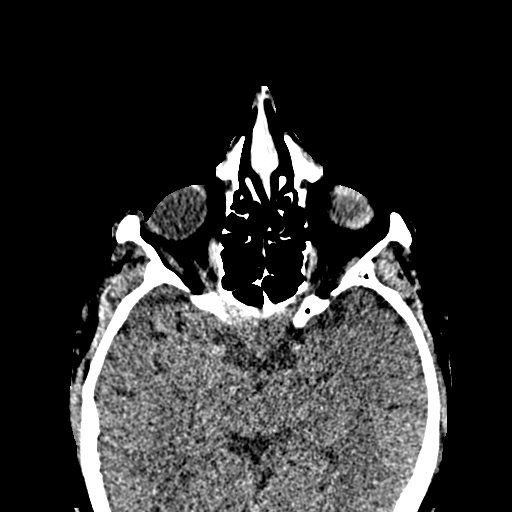
[im 32/61  bone]
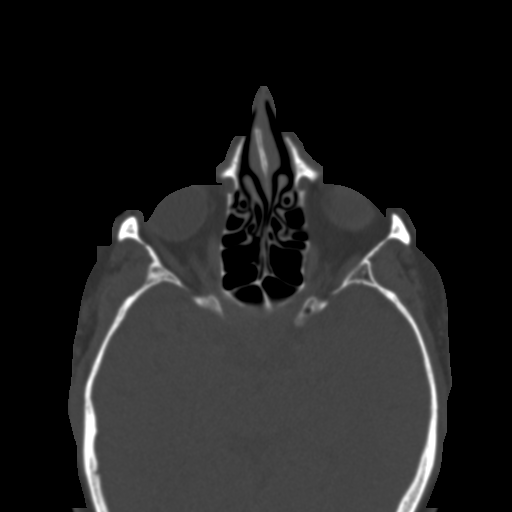
[im 36/61  bone]
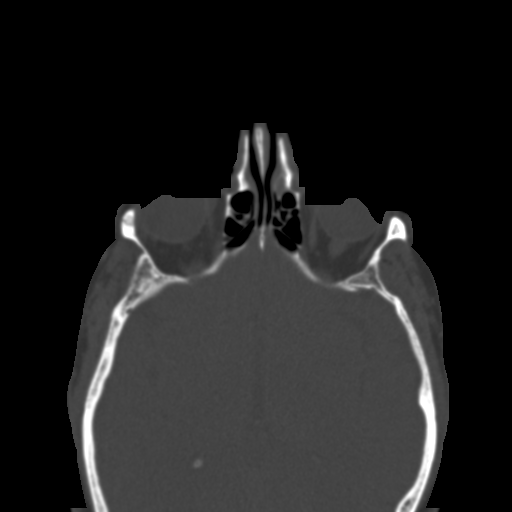
[im 40/61  bone]
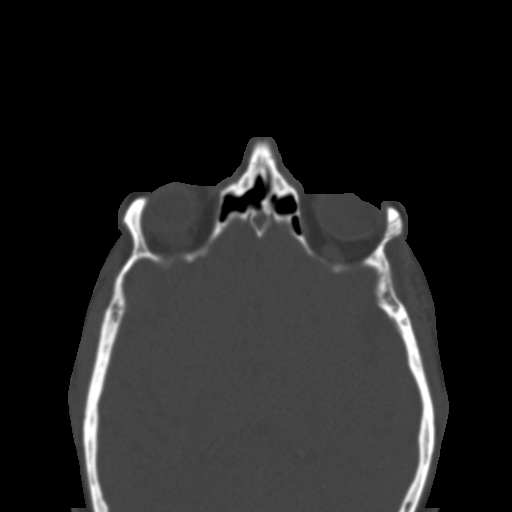
[im 44/61  bone]
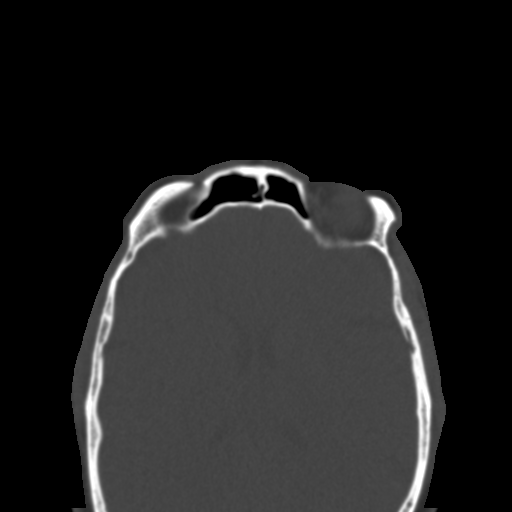
[im 46/61  brain]
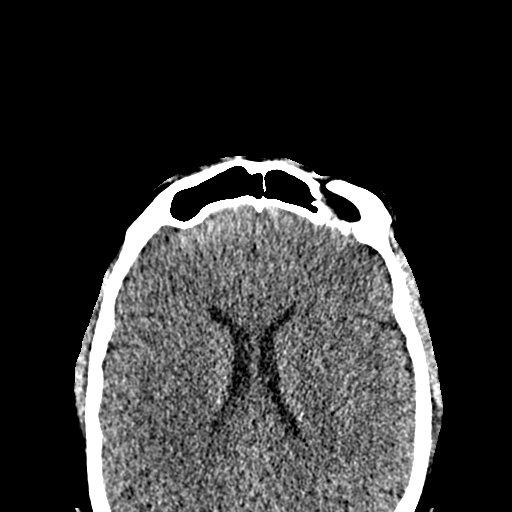
[im 46/61  bone]
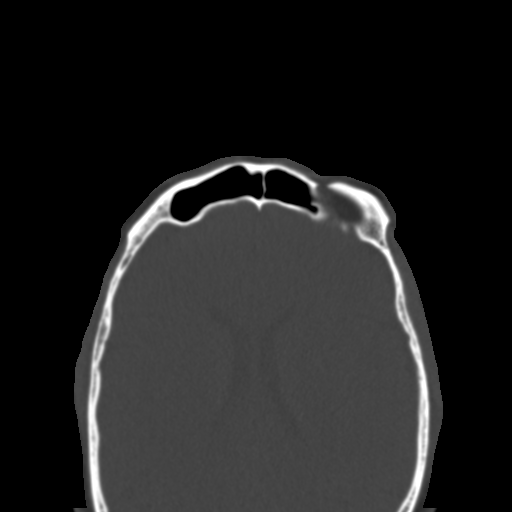
[im 50/61  bone]
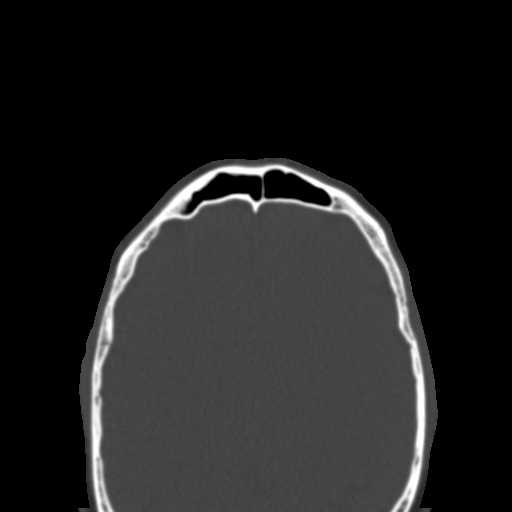
[im 54/61  bone]
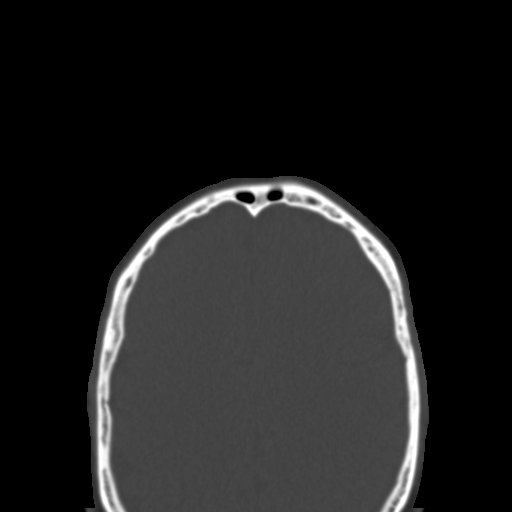
[im 58/61  bone]
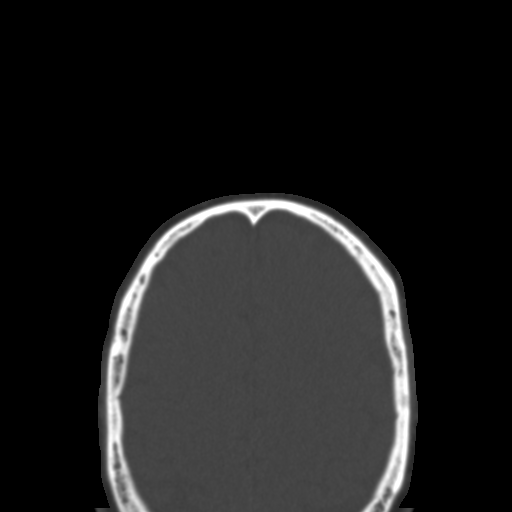

[16 of 30 positions shown; findings below may reference images not displayed]

FINDINGS: Orbits: A metallic foreign body is confirmed within the medial left
lower eyelid. This appears to be just below the skin surface. It is
separate from the globe.

Globes and orbits are otherwise within normal limits bilaterally.
The lenses are located. Extraocular muscles are normal. Superior
ophthalmic vein is normal in size.

Visualized sinuses: Clear

Soft tissues: Soft tissues the face are otherwise unremarkable.

Limited intracranial: Within normal limits.
IMPRESSION: 1. Punctate metallic fragment is imbedded within the medial aspect
of the left lower lid.
2. No other focal metallic foreign body.
3. Otherwise normal CT of the orbits.
